# Patient Record
Sex: Female | Born: 1985 | Race: White | Hispanic: No | Marital: Married | Smoking: Never smoker
Health system: Southern US, Community
[De-identification: ages and names within clinical notes are randomized; demographics above are authoritative.]

## PROBLEM LIST (undated history)

## (undated) DIAGNOSIS — C439 Malignant melanoma of skin, unspecified: Secondary | ICD-10-CM

## (undated) HISTORY — PX: SKIN BIOPSY: SHX1

## (undated) HISTORY — DX: Malignant melanoma of skin, unspecified: C43.9

---

## 2013-12-09 DIAGNOSIS — R87612 Low grade squamous intraepithelial lesion on cytologic smear of cervix (LGSIL): Secondary | ICD-10-CM | POA: Insufficient documentation

## 2014-06-02 DIAGNOSIS — Z87898 Personal history of other specified conditions: Secondary | ICD-10-CM | POA: Insufficient documentation

## 2014-06-02 DIAGNOSIS — Z0000 Encounter for general adult medical examination without abnormal findings: Secondary | ICD-10-CM | POA: Insufficient documentation

## 2014-06-02 DIAGNOSIS — D239 Other benign neoplasm of skin, unspecified: Secondary | ICD-10-CM | POA: Insufficient documentation

## 2014-06-02 DIAGNOSIS — Z86006 Personal history of melanoma in-situ: Secondary | ICD-10-CM | POA: Insufficient documentation

## 2019-02-04 ENCOUNTER — Other Ambulatory Visit: Payer: Self-pay

## 2019-02-08 ENCOUNTER — Ambulatory Visit (INDEPENDENT_AMBULATORY_CARE_PROVIDER_SITE_OTHER): Payer: BC Managed Care – PPO

## 2019-02-08 ENCOUNTER — Other Ambulatory Visit: Payer: Self-pay

## 2019-02-08 ENCOUNTER — Ambulatory Visit (HOSPITAL_COMMUNITY)
Admission: EM | Admit: 2019-02-08 | Discharge: 2019-02-08 | Disposition: A | Discharge status: Home / Self Care | Payer: BC Managed Care – PPO

## 2019-02-08 ENCOUNTER — Encounter (HOSPITAL_COMMUNITY): Payer: Self-pay

## 2019-02-08 DIAGNOSIS — R0781 Pleurodynia: Secondary | ICD-10-CM | POA: Diagnosis not present

## 2019-02-08 MED ORDER — DEXAMETHASONE SODIUM PHOSPHATE 10 MG/ML IJ SOLN
10.0000 mg | Freq: Once | INTRAMUSCULAR | Status: AC
  Administered 2019-02-08: 10 mg via INTRAMUSCULAR

## 2019-02-08 MED ORDER — DEXAMETHASONE SODIUM PHOSPHATE 10 MG/ML IJ SOLN
INTRAMUSCULAR | Status: AC
  Filled 2019-02-08: qty 1

## 2019-02-08 NOTE — ED Triage Notes (Signed)
Pt presents to the UC with SOB x 8 days. Pt tested positive x COVID 5 days ago. Pt states she has chest pain when she breath and SOB when walking x 8 days.

## 2019-02-08 NOTE — Discharge Instructions (Addendum)
Your x-ray was normal. Steroid injection given here for chest pain and lung inflammation. Go home and rest and follow-up as needed

## 2019-02-09 NOTE — ED Provider Notes (Signed)
[facility]    CSN: [id number] Arrival date & time: 02/08/19  1113      History   Chief Complaint Chief Complaint  Patient presents with  . Shortness of Breath    + COVID  . Chest Pain    HPI Analyce Becker is a 33 y.o. female.   Patient is a 33 year old female who presents today with shortness of breath x8 days.  She tested positive for Covid approximately 5 days ago.  Reporting pain in chest with deep breathing and shortness of breath with walking.  Symptoms have been constant, waxing waning. Mild cough.   Reporting she is typically very active and in pretty good health.  She has been working outside.  No fevers, chills, body aches, night sweats.  ROS per HPI    Shortness of Breath Associated symptoms: chest pain   Chest Pain Associated symptoms: shortness of breath     History reviewed. No pertinent past medical history.  There are no problems to display for this patient.   Past Surgical History:  Procedure Laterality Date  . SKIN BIOPSY      OB History   No obstetric history on file.      Home Medications    Prior to Admission medications   Medication Sig Start Date End Date Taking? Authorizing Provider  Pseudoephedrine-guaiFENesin [facility] D PO) Take by mouth.   Yes [provider]  medroxyPROGESTERone (DEPO-PROVERA) 150 MG/ML injection Inject 150 mg into the muscle every 3 (three) months. 01/14/19   [provider]    Family History Family History  Problem Relation Age of Onset  . Healthy Mother   . Healthy Father     Social History Social History   Tobacco Use  . Smoking status: Never Smoker  . Smokeless tobacco: Never Used  Substance Use Topics  . Alcohol use: Yes  . Drug use: Never     Allergies   Patient has no known allergies.   Review of Systems Review of Systems  Respiratory: Positive for shortness of breath.   Cardiovascular: Positive for chest pain.     Physical Exam Triage Vital  Signs ED Triage Vitals  Enc Vitals Group     BP 02/08/19 1232 117/84     Pulse Rate 02/08/19 1232 91     Resp 02/08/19 1232 16     Temp 02/08/19 1232 98.1 F (36.7 C)     Temp Source 02/08/19 1232 Oral     SpO2 02/08/19 1232 100 %     Weight --      Height --      Head Circumference --      Peak Flow --      Pain Score 02/08/19 1229 6     Pain Loc --      Pain Edu? --      Excl. in GC? --    No data found.  Updated Vital Signs BP 117/84 (BP Location: Left Arm)   Pulse 91   Temp 98.1 F (36.7 C) (Oral)   Resp 16   SpO2 100%   Visual Acuity Right Eye Distance:   Left Eye Distance:   Bilateral Distance:    Right Eye Near:   Left Eye Near:    Bilateral Near:     Physical Exam Vitals and nursing note reviewed.  Constitutional:      General: She is not in acute distress.    Appearance: Normal appearance. She is well-developed. She is not ill-appearing, toxic-appearing or  diaphoretic.  HENT:     Head: Normocephalic and atraumatic.     Nose: Nose normal.     Mouth/Throat:     Pharynx: Oropharynx is clear.  Eyes:     Conjunctiva/sclera: Conjunctivae normal.  Cardiovascular:     Rate and Rhythm: Normal rate and regular rhythm.     Heart sounds: No murmur.  Pulmonary:     Effort: Pulmonary effort is normal. No respiratory distress.     Breath sounds: Normal breath sounds.  Musculoskeletal:        General: Normal range of motion.     Cervical back: Neck supple.  Skin:    General: Skin is warm and dry.  Neurological:     Mental Status: She is alert.  Psychiatric:        Mood and Affect: Mood normal.      UC Treatments / Results  Labs (all labs ordered are listed, but only abnormal results are displayed) Labs Reviewed - No data to display  EKG   Radiology DG Chest 2 View  Result Date: 02/08/2019 CLINICAL DATA:  Cough, shortness of breath, COVID-19 positive. EXAM: CHEST - 2 VIEW COMPARISON:  None. FINDINGS: The heart size and mediastinal contours are  within normal limits. There is no focal pneumonia or pulmonary edema. There are probable minimal bilateral pleural effusions. The visualized skeletal structures are unremarkable. IMPRESSION: No focal pneumonia.  Probable minimal bilateral pleural effusions. Electronically Signed   By: Abelardo Diesel M.D.   On: 02/08/2019 12:57    Procedures Procedures (including critical care time)  Medications Ordered in UC Medications  dexamethasone (DECADRON) injection 10 mg (10 mg Intramuscular Given 02/08/19 1317)    Initial Impression / Assessment and Plan / UC Course  I have reviewed the triage vital signs and the nursing notes.  Pertinent labs & imaging results that were available during my care of the patient were reviewed by me and considered in my medical decision making (see chart for details).     Pleuritic chest pain-most likely from the virus. Chest x-ray without any acute abnormalities Will give Decadron injection here for lung inflammation.  This should improve her symptoms. Recommended go home rest and avoid strenuous activity for the next week. Vital signs are normal with a 100 percent oxygen saturations. Final Clinical Impressions(s) / UC Diagnoses   Final diagnoses:  Chest pain, pleuritic     Discharge Instructions     Your x-ray was normal. Steroid injection given here for chest pain and lung inflammation. Go home and rest and follow-up as needed    ED Prescriptions    None     PDMP not reviewed this encounter.   Orvan July, NP 02/09/19 1525

## 2019-06-24 ENCOUNTER — Other Ambulatory Visit: Payer: Self-pay

## 2019-06-24 ENCOUNTER — Ambulatory Visit
Admission: EM | Admit: 2019-06-24 | Discharge: 2019-06-24 | Disposition: A | Discharge status: Home / Self Care | Payer: BC Managed Care – PPO | Attending: Emergency Medicine | Admitting: Emergency Medicine

## 2019-06-24 DIAGNOSIS — J019 Acute sinusitis, unspecified: Secondary | ICD-10-CM | POA: Diagnosis not present

## 2019-06-24 MED ORDER — AMOXICILLIN-POT CLAVULANATE 875-125 MG PO TABS: 1.0000 | ORAL_TABLET | Freq: Two times a day (BID) | ORAL | 0 refills | Status: AC

## 2019-06-24 MED ORDER — FLUCONAZOLE 200 MG PO TABS: ORAL_TABLET | ORAL | 0 refills | Status: DC

## 2019-06-24 NOTE — ED Provider Notes (Signed)
[facility]   [id number] 06/24/19 Arrival Time: 6387   CC: sinus congestion  SUBJECTIVE: History from: patient.  Amber Becker is a 34 y.o. female who presents with nasal congestion and sinus headache x 2 weeks.  Daughter with similar symptoms.  Has tried OTC medications without relief.  Denies aggravating factors.  Reports previous symptoms in the past with sinus infection.   Denies fever, chills, sore throat, SOB, wheezing, chest pain, nausea, vomiting, changes in bowel or bladder habits.     ROS: As per HPI.  All other pertinent ROS negative.     History reviewed. No pertinent past medical history. Past Surgical History:  Procedure Laterality Date  . SKIN BIOPSY     No Known Allergies No current facility-administered medications on file prior to encounter.   Current Outpatient Medications on File Prior to Encounter  Medication Sig Dispense Refill  . Pseudoephedrine-guaiFENesin (MUCINEX D PO) Take by mouth.    . [DISCONTINUED] medroxyPROGESTERone (DEPO-PROVERA) 150 MG/ML injection Inject 150 mg into the muscle every 3 (three) months.     Social History   Socioeconomic History  . Marital status: Married    Spouse name: Not on file  . Number of children: Not on file  . Years of education: Not on file  . Highest education level: Not on file  Occupational History  . Not on file  Tobacco Use  . Smoking status: Never Smoker  . Smokeless tobacco: Never Used  Substance and Sexual Activity  . Alcohol use: Yes  . Drug use: Never  . Sexual activity: Yes  Other Topics Concern  . Not on file  Social History Narrative  . Not on file   Social Determinants of Health   Financial Resource Strain:   . Difficulty of Paying Living Expenses:   Food Insecurity:   . Worried About Programme researcher, broadcasting/film/video in the Last Year:   . Barista in the Last Year:   Transportation Needs:   . Freight forwarder (Medical):   Marland Kitchen Lack of Transportation (Non-Medical):   Physical  Activity:   . Days of Exercise per Week:   . Minutes of Exercise per Session:   Stress:   . Feeling of Stress :   Social Connections:   . Frequency of Communication with Friends and Family:   . Frequency of Social Gatherings with Friends and Family:   . Attends Religious Services:   . Active Member of Clubs or Organizations:   . Attends Banker Meetings:   Marland Kitchen Marital Status:   Intimate Partner Violence:   . Fear of Current or Ex-Partner:   . Emotionally Abused:   Marland Kitchen Physically Abused:   . Sexually Abused:    Family History  Problem Relation Age of Onset  . Healthy Mother   . Healthy Father     OBJECTIVE:  Vitals:   06/24/19 0857  BP: (!) 134/93  Pulse: 62  Resp: 18  Temp: 97.6 F (36.4 C)  SpO2: 97%     General appearance: alert; appears mildly fatigued, but nontoxic; speaking in full sentences and tolerating own secretions HEENT: NCAT; Ears: EACs clear, TMs pearly gray; Eyes: PERRL.  EOM grossly intact. Sinuses: TTP over frontal and maxillary sinuses; Nose: nares patent without rhinorrhea, Throat: oropharynx clear, tonsils non erythematous or enlarged, uvula midline  Neck: supple without LAD Lungs: unlabored respirations, symmetrical air entry; cough: absent; no respiratory distress; CTAB Heart: regular rate and rhythm.   Skin: warm and dry Psychological:  alert and cooperative; normal mood and affect  ASSESSMENT & PLAN:  1. Acute non-recurrent sinusitis, unspecified location     Meds ordered this encounter  Medications  . amoxicillin-clavulanate (AUGMENTIN) 875-125 MG tablet    Sig: Take 1 tablet by mouth every 12 (twelve) hours for 10 days.    Dispense:  20 tablet    Refill:  0    Order Specific Question:   Supervising Provider    Answer:   Raylene Everts [[id number]]  . fluconazole (DIFLUCAN) 200 MG tablet    Sig: Take one dose by mouth, wait 72 hours, and then take second dose by mouth    Dispense:  2 tablet    Refill:  0    Order Specific  Question:   Supervising Provider    Answer:   Raylene Everts [[id number]]   Get plenty of rest and push fluids Augmentin prescribed for sinus infection.  Take as directed and to completon Use OTC zyrtec for nasal congestion, runny nose, and/or sore throat Use OTC flonase for nasal congestion and runny nose Use medications daily for symptom relief Use OTC medications like ibuprofen or tylenol as needed fever or pain Call or go to the ED if you have any new or worsening symptoms such as fever, worsening cough, shortness of breath, chest tightness, chest pain, turning blue, changes in mental status, etc...   Diflucan sent for possible yeast infection secondary to taking antibiotic  Reviewed expectations re: course of current medical issues. Questions answered. Outlined signs and symptoms indicating need for more acute intervention. Patient verbalized understanding. After Visit Summary given.         Lestine Box, PA-C 06/24/19 [phone]

## 2019-06-24 NOTE — ED Triage Notes (Signed)
Pt presents with c/o nasal congestion and head ache for almost 2 weeks

## 2019-06-24 NOTE — Discharge Instructions (Signed)
Get plenty of rest and push fluids Augmentin prescribed for sinus infection.  Take as directed and to completon Use OTC zyrtec for nasal congestion, runny nose, and/or sore throat Use OTC flonase for nasal congestion and runny nose Use medications daily for symptom relief Use OTC medications like ibuprofen or tylenol as needed fever or pain Call or go to the ED if you have any new or worsening symptoms such as fever, worsening cough, shortness of breath, chest tightness, chest pain, turning blue, changes in mental status, etc..Marland Kitchen

## 2021-04-14 IMAGING — DX DG CHEST 2V
2 series · 2 of 2 positions shown · non-contrast
Comparison: None.

CLINICAL DATA: Cough, shortness of breath, [id number] positive.

EXAM:
CHEST - 2 VIEW

[chest pa]
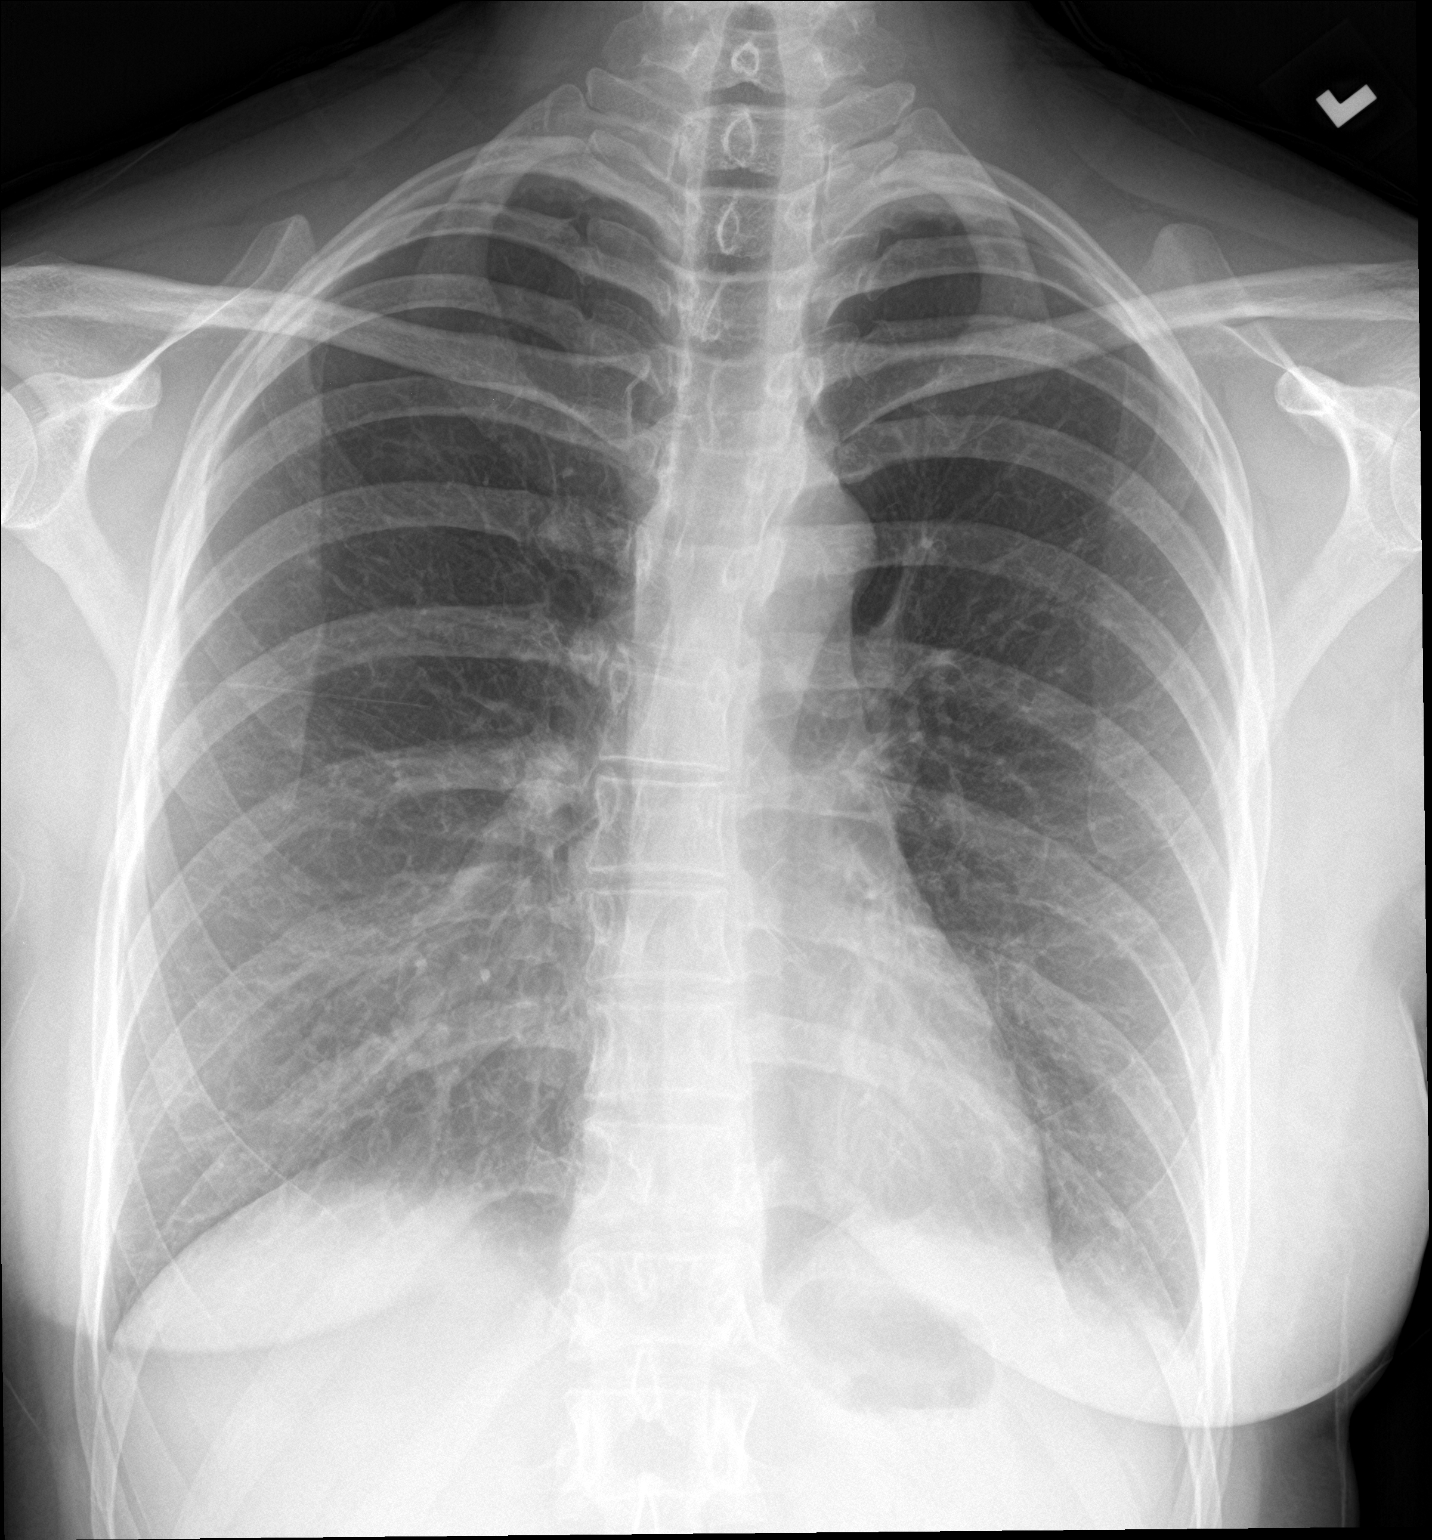

[chest lat]
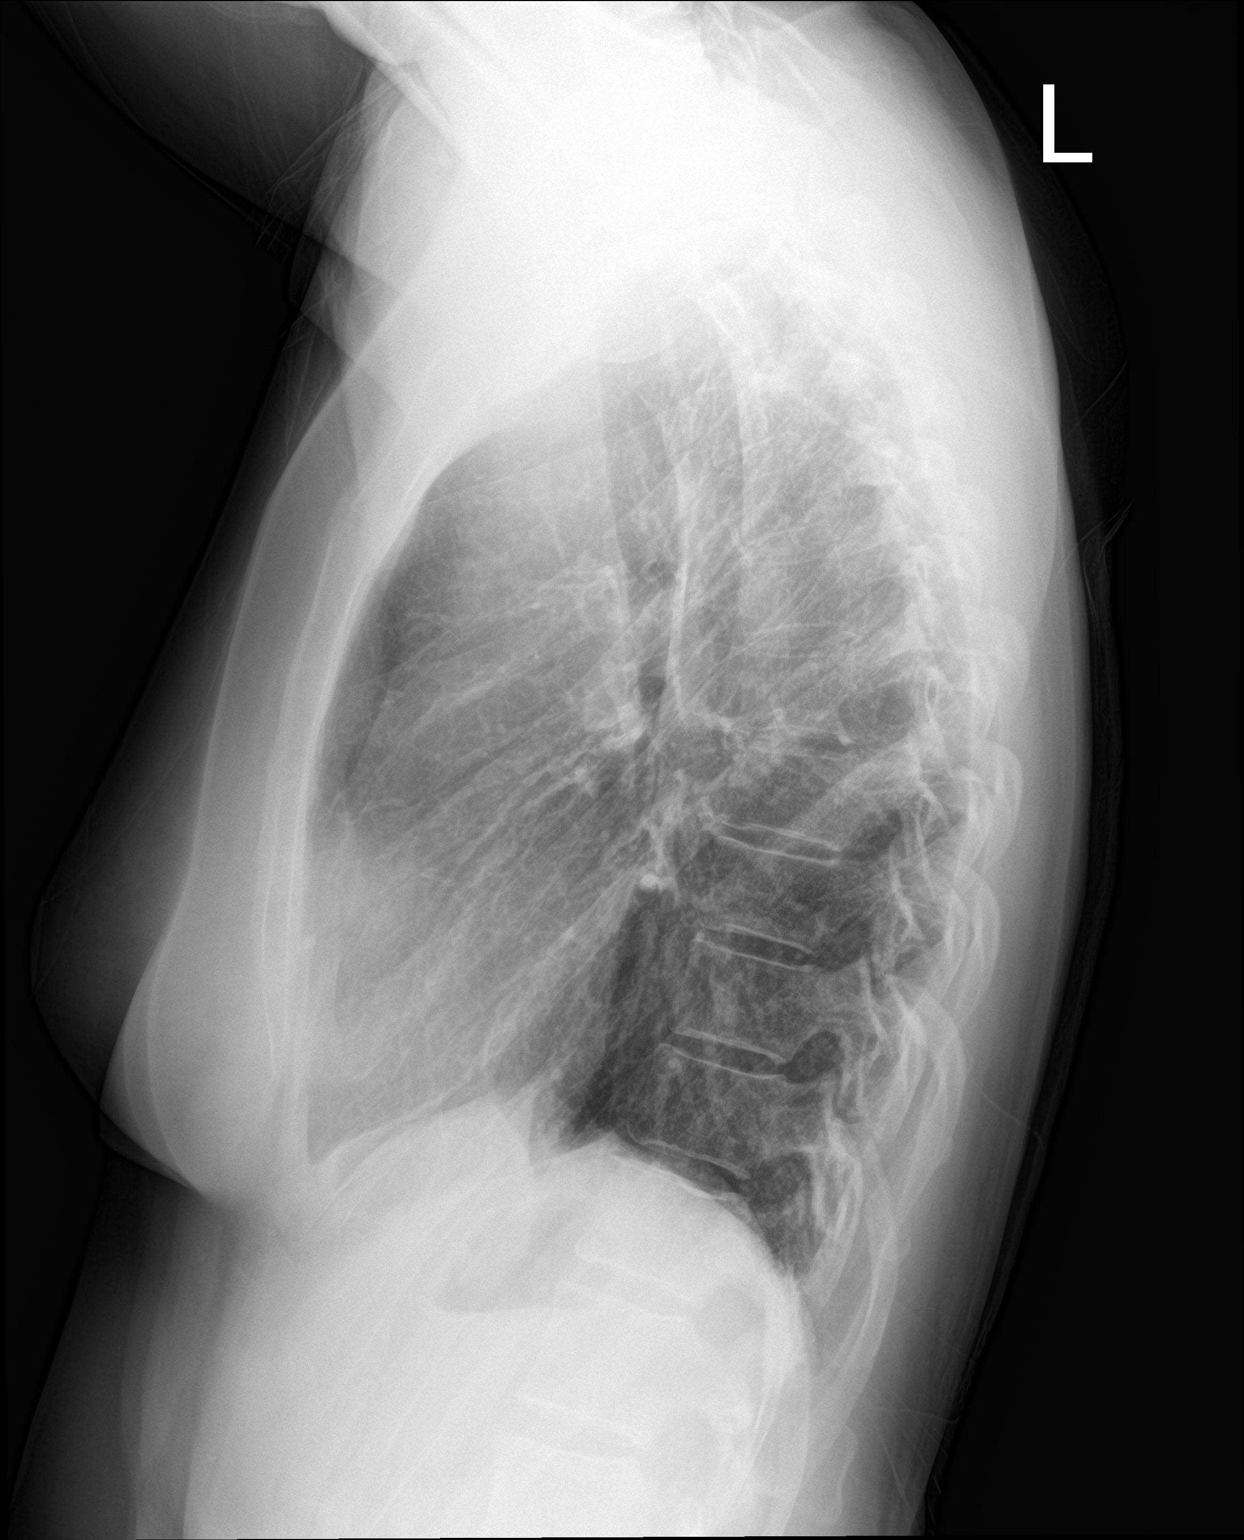

[2 of 2 positions shown; findings below may reference images not displayed]

FINDINGS: The heart size and mediastinal contours are within normal limits.
There is no focal pneumonia or pulmonary edema. There are probable
minimal bilateral pleural effusions. The visualized skeletal
structures are unremarkable.
IMPRESSION: No focal pneumonia.  Probable minimal bilateral pleural effusions.

## 2021-08-14 ENCOUNTER — Ambulatory Visit: Payer: Self-pay

## 2022-10-17 DIAGNOSIS — R87810 Cervical high risk human papillomavirus (HPV) DNA test positive: Secondary | ICD-10-CM | POA: Diagnosis not present

## 2022-10-17 DIAGNOSIS — Z1331 Encounter for screening for depression: Secondary | ICD-10-CM | POA: Diagnosis not present

## 2022-10-17 DIAGNOSIS — Z01419 Encounter for gynecological examination (general) (routine) without abnormal findings: Secondary | ICD-10-CM | POA: Diagnosis not present

## 2023-03-16 DIAGNOSIS — F411 Generalized anxiety disorder: Secondary | ICD-10-CM | POA: Diagnosis not present

## 2023-03-16 DIAGNOSIS — J45909 Unspecified asthma, uncomplicated: Secondary | ICD-10-CM | POA: Diagnosis not present

## 2023-03-16 DIAGNOSIS — Z0000 Encounter for general adult medical examination without abnormal findings: Secondary | ICD-10-CM | POA: Diagnosis not present

## 2023-03-16 DIAGNOSIS — Z85820 Personal history of malignant melanoma of skin: Secondary | ICD-10-CM | POA: Diagnosis not present

## 2023-03-16 DIAGNOSIS — Z87891 Personal history of nicotine dependence: Secondary | ICD-10-CM | POA: Diagnosis not present

## 2023-06-15 ENCOUNTER — Other Ambulatory Visit: Payer: Self-pay | Admitting: Medical Genetics

## 2023-07-10 DIAGNOSIS — M542 Cervicalgia: Secondary | ICD-10-CM | POA: Diagnosis not present

## 2023-07-10 DIAGNOSIS — M9911 Subluxation complex (vertebral) of cervical region: Secondary | ICD-10-CM | POA: Diagnosis not present

## 2023-07-10 DIAGNOSIS — M62830 Muscle spasm of back: Secondary | ICD-10-CM | POA: Diagnosis not present

## 2023-07-10 DIAGNOSIS — M9913 Subluxation complex (vertebral) of lumbar region: Secondary | ICD-10-CM | POA: Diagnosis not present

## 2023-07-10 DIAGNOSIS — M9912 Subluxation complex (vertebral) of thoracic region: Secondary | ICD-10-CM | POA: Diagnosis not present

## 2023-07-10 DIAGNOSIS — M546 Pain in thoracic spine: Secondary | ICD-10-CM | POA: Diagnosis not present

## 2023-08-28 ENCOUNTER — Ambulatory Visit: Admitting: Dermatology

## 2023-08-28 ENCOUNTER — Encounter: Payer: Self-pay | Admitting: Dermatology

## 2023-08-28 VITALS — BP 105/78 | HR 77

## 2023-08-28 DIAGNOSIS — D492 Neoplasm of unspecified behavior of bone, soft tissue, and skin: Secondary | ICD-10-CM | POA: Diagnosis not present

## 2023-08-28 DIAGNOSIS — Z1283 Encounter for screening for malignant neoplasm of skin: Secondary | ICD-10-CM | POA: Diagnosis not present

## 2023-08-28 DIAGNOSIS — D229 Melanocytic nevi, unspecified: Secondary | ICD-10-CM

## 2023-08-28 DIAGNOSIS — D1801 Hemangioma of skin and subcutaneous tissue: Secondary | ICD-10-CM | POA: Diagnosis not present

## 2023-08-28 DIAGNOSIS — Z85820 Personal history of malignant melanoma of skin: Secondary | ICD-10-CM

## 2023-08-28 DIAGNOSIS — L821 Other seborrheic keratosis: Secondary | ICD-10-CM

## 2023-08-28 DIAGNOSIS — D225 Melanocytic nevi of trunk: Secondary | ICD-10-CM

## 2023-08-28 DIAGNOSIS — L814 Other melanin hyperpigmentation: Secondary | ICD-10-CM

## 2023-08-28 DIAGNOSIS — L578 Other skin changes due to chronic exposure to nonionizing radiation: Secondary | ICD-10-CM | POA: Diagnosis not present

## 2023-08-28 DIAGNOSIS — D485 Neoplasm of uncertain behavior of skin: Secondary | ICD-10-CM

## 2023-08-28 DIAGNOSIS — D2272 Melanocytic nevi of left lower limb, including hip: Secondary | ICD-10-CM | POA: Diagnosis not present

## 2023-08-28 DIAGNOSIS — W908XXA Exposure to other nonionizing radiation, initial encounter: Secondary | ICD-10-CM | POA: Diagnosis not present

## 2023-08-28 NOTE — Progress Notes (Unsigned)
 New Patient Visit   Subjective  Amber Becker is a 38 y.o. female who presents for the following: Skin Cancer Screening and Full Body Skin Exam  The patient presents for Total-Body Skin Exam (TBSE) for skin cancer screening and mole check. The patient has spots, moles and lesions to be evaluated, some may be new or changing.  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  The following portions of the chart were reviewed this encounter and updated as appropriate: medications, allergies, medical history  Review of Systems:  No other skin or systemic complaints except as noted in HPI or Assessment and Plan.  Objective  Well appearing patient in no apparent distress; mood and affect are within normal limits.  A full examination was performed including scalp, head, eyes, ears, nose, lips, neck, chest, axillae, abdomen, back, buttocks, bilateral upper extremities, bilateral lower extremities, hands, feet, fingers, toes, fingernails, and toenails. All findings within normal limits unless otherwise noted below.   Relevant physical exam findings are noted in the Assessment and Plan.  Left Lower Leg - Anterior 2 mm brown macule  Right Lower Back 5 mm brown papule  Left Lower Back 6 mm brown papule with two tones   Assessment & Plan   SKIN CANCER SCREENING PERFORMED TODAY.  ACTINIC DAMAGE - Chronic condition, secondary to cumulative UV/sun exposure - diffuse scaly erythematous macules with underlying dyspigmentation - Recommend daily broad spectrum sunscreen SPF 30+ to sun-exposed areas, reapply every 2 hours as needed.  - Staying in the shade or wearing long sleeves, sun glasses (UVA+UVB protection) and wide brim hats (4-inch brim around the entire circumference of the hat) are also recommended for sun protection.  -  Call for new or changing lesions.  LENTIGINES, SEBORRHEIC KERATOSES, HEMANGIOMAS - Benign normal skin lesions - Benign-appearing - Call for any changes  MELANOCYTIC NEVI - Tan-brown and/or pink-flesh-colored symmetric macules and papules - Benign appearing on exam today - Observation - Call clinic for new or changing moles - Recommend daily use of broad spectrum spf 30+ sunscreen to sun-exposed areas.   HISTORY OF MELANOMA - No evidence of recurrence today - No lymphadenopathy - Recommend regular full body skin exams - Recommend daily broad spectrum sunscreen SPF 30+ to sun-exposed areas, reapply every 2 hours as needed.  - Call if any new or changing lesions are noted between office visits   NEOPLASM OF UNCERTAIN BEHAVIOR OF SKIN (3) Left Lower Leg - Anterior Skin / nail biopsy Type of biopsy: tangential   Informed consent: discussed and consent obtained   Timeout: patient name, date of birth, surgical site, and procedure verified   Procedure prep:  Patient was prepped and draped in usual sterile fashion Prep type:  Isopropyl alcohol Anesthesia: the lesion was anesthetized in a standard fashion   Anesthetic:  1% lidocaine w/ epinephrine 1-100,000 buffered w/ 8.4% NaHCO3 Instrument used: DermaBlade   Hemostasis achieved with: aluminum chloride   Outcome: patient tolerated procedure well   Post-procedure details: sterile dressing applied and wound care instructions given   Dressing type: petrolatum gauze and bandage    Specimen 1 - Surgical pathology Differential Diagnosis: r/o DN VS MM  Check Margins: No Right Lower Back Skin / nail biopsy Type of biopsy: tangential   Informed consent: discussed and consent obtained   Timeout: patient name, date  of birth, surgical site, and procedure verified   Procedure prep:  Patient was prepped and draped in usual sterile fashion Prep type:  Isopropyl alcohol Anesthesia: the lesion was anesthetized in a standard fashion    Anesthetic:  1% lidocaine w/ epinephrine 1-100,000 buffered w/ 8.4% NaHCO3 Instrument used: DermaBlade   Hemostasis achieved with: aluminum chloride   Outcome: patient tolerated procedure well   Post-procedure details: sterile dressing applied and wound care instructions given   Dressing type: petrolatum gauze and bandage    Specimen 2 - Surgical pathology Differential Diagnosis: r/o DN VS MM  Check Margins: No Left Lower Back Skin / nail biopsy Type of biopsy: tangential   Informed consent: discussed and consent obtained   Timeout: patient name, date of birth, surgical site, and procedure verified   Procedure prep:  Patient was prepped and draped in usual sterile fashion Prep type:  Isopropyl alcohol Anesthesia: the lesion was anesthetized in a standard fashion   Anesthetic:  1% lidocaine w/ epinephrine 1-100,000 buffered w/ 8.4% NaHCO3 Instrument used: DermaBlade   Hemostasis achieved with: aluminum chloride   Outcome: patient tolerated procedure well   Post-procedure details: sterile dressing applied and wound care instructions given   Dressing type: petrolatum gauze and bandage    Specimen 3 - Surgical pathology Differential Diagnosis: r/o DN VS MM  Check Margins: No PERSONAL HISTORY OF MALIGNANT MELANOMA OF SKIN   MULTIPLE BENIGN NEVI   LENTIGINES   CHERRY ANGIOMA   ACTINIC SKIN DAMAGE   HEMANGIOMA OF SKIN    Return in about 6 months (around 02/28/2024) for TBSC.  I, Berwyn Lesches, Surg Tech III, am acting as scribe for RUFUS CHRISTELLA HOLY, MD.   Documentation: I have reviewed the above documentation for accuracy and completeness, and I agree with the above.  RUFUS CHRISTELLA HOLY, MD

## 2023-08-28 NOTE — Patient Instructions (Signed)

## 2023-08-29 LAB — SURGICAL PATHOLOGY

## 2023-08-31 ENCOUNTER — Ambulatory Visit: Payer: Self-pay | Admitting: Dermatology

## 2023-08-31 NOTE — Telephone Encounter (Signed)
-----   Message from [facility] PACI sent at 08/31/2023 10:58 AM EDT ----- Lower lower leg- DN mod to severe- WLE Right lower back- DN moderate to severe- WLE Left lower back- DN moderate- monitor Please call patient to get scheduled for WLEs   ----- Message ----- From: Interface, Lab In [facility] Sent: 08/29/2023   5:58 PM EDT To: Rufus CHRISTELLA Holy, MD

## 2023-10-18 DIAGNOSIS — R87610 Atypical squamous cells of undetermined significance on cytologic smear of cervix (ASC-US): Secondary | ICD-10-CM | POA: Diagnosis not present

## 2023-10-18 DIAGNOSIS — Z1331 Encounter for screening for depression: Secondary | ICD-10-CM | POA: Diagnosis not present

## 2023-10-18 DIAGNOSIS — Z113 Encounter for screening for infections with a predominantly sexual mode of transmission: Secondary | ICD-10-CM | POA: Diagnosis not present

## 2023-10-18 DIAGNOSIS — Z01419 Encounter for gynecological examination (general) (routine) without abnormal findings: Secondary | ICD-10-CM | POA: Diagnosis not present

## 2023-10-31 ENCOUNTER — Encounter: Payer: Self-pay | Admitting: Dermatology

## 2023-11-05 ENCOUNTER — Encounter: Payer: Self-pay | Admitting: Dermatology

## 2023-11-05 ENCOUNTER — Ambulatory Visit: Admitting: Dermatology

## 2023-11-05 VITALS — BP 114/79 | HR 64 | Temp 98.3°F

## 2023-11-05 DIAGNOSIS — D225 Melanocytic nevi of trunk: Secondary | ICD-10-CM | POA: Diagnosis not present

## 2023-11-05 DIAGNOSIS — L988 Other specified disorders of the skin and subcutaneous tissue: Secondary | ICD-10-CM | POA: Diagnosis not present

## 2023-11-05 DIAGNOSIS — D239 Other benign neoplasm of skin, unspecified: Secondary | ICD-10-CM

## 2023-11-05 NOTE — Progress Notes (Signed)
 Follow-Up Visit   Subjective  Amber Becker is a 38 y.o. female who presents for the following: Excision of a DN severe on the right lower back, biopsied by Dr. Corey.  The following portions of the chart were reviewed this encounter and updated as appropriate: medications, allergies, medical history  Review of Systems:  No other skin or systemic complaints except as noted in HPI or Assessment and Plan.  Objective  Well appearing patient in no apparent distress; mood and affect are within normal limits.  A focused examination was performed of the following areas: Right lower back Relevant physical exam findings are noted in the Assessment and Plan.   Right Lower Back Healing biopsy site  Assessment & Plan   DYSPLASTIC NEVUS Right Lower Back Skin excision - Right Lower Back  Excision method:  elliptical Lesion length (cm):  1.1 Lesion width (cm):  0.8 Margin per side (cm):  0.5 Total excision diameter (cm):  2.1 Informed consent: discussed and consent obtained   Timeout: patient name, date of birth, surgical site, and procedure verified   Procedure prep:  Patient was prepped and draped in usual sterile fashion Prep type:  Chlorhexidine Anesthesia: the lesion was anesthetized in a standard fashion   Anesthetic:  1% lidocaine w/ epinephrine 1-100,000 local infiltration Instrument used: #15 blade   Hemostasis achieved with: suture, pressure and electrodesiccation   Outcome: patient tolerated procedure well with no complications   Post-procedure details: sterile dressing applied and wound care instructions given   Dressing type: bandage and pressure dressing    Skin repair - Right Lower Back Complexity:  Complex Final length (cm):  5 Informed consent: discussed and consent obtained   Timeout: patient name, date of birth, surgical site, and procedure verified   Procedure prep:  Patient was prepped and draped in usual sterile fashion Prep type:  Chlorhexidine Anesthesia:  the lesion was anesthetized in a standard fashion   Anesthetic:  1% lidocaine w/ epinephrine 1-100,000 buffered w/ 8.4% NaHCO3 Reason for type of repair: reduce tension to allow closure, preserve normal anatomy, preserve normal anatomical and functional relationships, avoid adjacent structures and allow side-to-side closure without requiring a flap or graft   Undermining: area extensively undermined   Subcutaneous layers (deep stitches):  Suture size:  3-0 Suture type: PDS (polydioxanone)   Stitches:  Buried vertical mattress Fine/surface layer approximation (top stitches):  Suture type: cyanoacrylate tissue glue   Hemostasis achieved with: suture, pressure and electrodesiccation Outcome: patient tolerated procedure well with no complications   Post-procedure details: sterile dressing applied and wound care instructions given   Dressing type: petrolatum and pressure dressing    Specimen 1 - Surgical pathology Differential Diagnosis: DN [id number] Check Margins: No  The surgical wound was then cleaned, prepped, and re-anesthetized as above. Wound edges were undermined extensively along at least one entire edge and at a distance equal to or greater than the width of the defect (see wound defect size above) in order to achieve closure and decrease wound tension and anatomic distortion. Redundant tissue repair including standing cone removal was performed. Hemostasis was achieved with electrocautery. Subcutaneous and epidermal tissues were approximated with the above sutures. The surgical site was then lightly scrubbed with sterile, saline-soaked gauze. Steri-strips were applied, and the area was then bandaged using Vaseline ointment, non-adherent gauze, gauze pads, and tape to provide an adequate pressure dressing. The patient tolerated the procedure well, was given detailed written and verbal wound care instructions, and was discharged in good condition.  The patient will follow-up:  PRN.  Return if symptoms worsen or fail to improve.   Documentation: I have reviewed the above documentation for accuracy and completeness, and I agree with the above.  RUFUS CHRISTELLA HOLY, MD

## 2023-11-05 NOTE — Patient Instructions (Signed)

## 2023-11-06 ENCOUNTER — Ambulatory Visit: Payer: Self-pay | Admitting: Dermatology

## 2023-11-06 LAB — SURGICAL PATHOLOGY

## 2023-11-12 ENCOUNTER — Encounter: Payer: Self-pay | Admitting: Dermatology

## 2023-11-15 DIAGNOSIS — Z118 Encounter for screening for other infectious and parasitic diseases: Secondary | ICD-10-CM | POA: Diagnosis not present

## 2023-11-15 DIAGNOSIS — N898 Other specified noninflammatory disorders of vagina: Secondary | ICD-10-CM | POA: Diagnosis not present

## 2023-11-15 DIAGNOSIS — Z7251 High risk heterosexual behavior: Secondary | ICD-10-CM | POA: Diagnosis not present

## 2023-11-15 DIAGNOSIS — Z113 Encounter for screening for infections with a predominantly sexual mode of transmission: Secondary | ICD-10-CM | POA: Diagnosis not present

## 2023-11-19 ENCOUNTER — Ambulatory Visit: Admitting: Dermatology

## 2023-11-19 ENCOUNTER — Encounter: Payer: Self-pay | Admitting: Dermatology

## 2023-11-19 VITALS — BP 118/77 | HR 61

## 2023-11-19 DIAGNOSIS — L905 Scar conditions and fibrosis of skin: Secondary | ICD-10-CM

## 2023-11-19 DIAGNOSIS — D2272 Melanocytic nevi of left lower limb, including hip: Secondary | ICD-10-CM | POA: Diagnosis not present

## 2023-11-19 DIAGNOSIS — D239 Other benign neoplasm of skin, unspecified: Secondary | ICD-10-CM

## 2023-11-19 MED ORDER — MUPIROCIN 2 % EX OINT: 1.0000 | TOPICAL_OINTMENT | Freq: Two times a day (BID) | CUTANEOUS | 1 refills | Status: AC

## 2023-11-19 NOTE — Patient Instructions (Signed)

## 2023-11-19 NOTE — Progress Notes (Signed)
   Follow-Up Visit   Subjective  Amber Becker is a 38 y.o. female who presents for the following: Excision of a DN moderate to severe on the left lower leg anterior.  She is also s/p WLE for a DN moderate to severe on the right lower back, treated on 09/22/025, repaired with linear closure, healing well.   The following portions of the chart were reviewed this encounter and updated as appropriate: medications, allergies, medical history  Review of Systems:  No other skin or systemic complaints except as noted in HPI or Assessment and Plan.  Objective  Well appearing patient in no apparent distress; mood and affect are within normal limits.  A focused examination was performed of the following areas: Left lower leg anterior Relevant physical exam findings are noted in the Assessment and Plan.   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 Skin excision - Left Lower Leg - Anterior  Excision method:  elliptical Lesion length (cm):  0.7 Lesion width (cm):  0.6 Margin per side (cm):  0.5 Total excision diameter (cm):  1.7 Informed consent: discussed and consent obtained   Timeout: patient name, date of birth, surgical site, and procedure verified   Procedure prep:  Patient was prepped and draped in usual sterile fashion Prep type:  Chlorhexidine Anesthesia: the lesion was anesthetized in a standard fashion   Anesthetic:  1% lidocaine w/ epinephrine 1-100,000 buffered w/ 8.4% NaHCO3 Instrument used: #15 blade   Hemostasis achieved with: electrodesiccation   Outcome: patient tolerated procedure well with no complications   Post-procedure details: wound care instructions given    Specimen 1 - Surgical pathology Differential Diagnosis: DN [id number] Check Margins: No  Patient was notified of results and repair options were discussed, including second intention healing. After reviewing the advantages and  disadvantages of each, we agreed on second intention healing as appropriate.   The surgical site was then lightly scrubbed with sterile, saline-soaked gauze.  The area was bandaged using Vaseline ointment, non-adherent gauze, gauze pads, and tape to provide an adequate pressure dressing.   The patient tolerated the procedure well, was given detailed written and verbal wound care instructions, and was discharged in good condition.  The patient will follow-up in 4 weeks and as scheduled with primary dermatologist.  Scar s/p WLE for DN moderate to severe on the right lower back, treated on 09/22/025, repaired with linear closure - Reassured that wound has healed well - Steristrips still present - Discussed that scars take up to 12 months to mature from the date of surgery - Recommend SPF 30+ to scar daily to prevent purple color - OK to start scar massage at 4-6 weeks post-op - Can consider silicone based products for scar healing   Return in about 4 weeks (around 12/17/2023) for wound check.  I, Darice Smock, CMA, am acting as scribe for RUFUS CHRISTELLA HOLY, MD.   Documentation: I have reviewed the above documentation for accuracy and completeness, and I agree with the above.  RUFUS CHRISTELLA HOLY, MD

## 2023-11-20 ENCOUNTER — Ambulatory Visit: Payer: Self-pay | Admitting: Dermatology

## 2023-11-20 LAB — SURGICAL PATHOLOGY

## 2023-12-18 ENCOUNTER — Ambulatory Visit: Admitting: Dermatology

## 2024-01-02 DIAGNOSIS — Z1231 Encounter for screening mammogram for malignant neoplasm of breast: Secondary | ICD-10-CM | POA: Diagnosis not present

## 2024-03-04 ENCOUNTER — Encounter: Payer: Self-pay | Admitting: Dermatology

## 2024-03-04 ENCOUNTER — Ambulatory Visit: Admitting: Dermatology

## 2024-03-04 DIAGNOSIS — L814 Other melanin hyperpigmentation: Secondary | ICD-10-CM

## 2024-03-04 DIAGNOSIS — L578 Other skin changes due to chronic exposure to nonionizing radiation: Secondary | ICD-10-CM | POA: Diagnosis not present

## 2024-03-04 DIAGNOSIS — L821 Other seborrheic keratosis: Secondary | ICD-10-CM | POA: Diagnosis not present

## 2024-03-04 DIAGNOSIS — D1801 Hemangioma of skin and subcutaneous tissue: Secondary | ICD-10-CM

## 2024-03-04 DIAGNOSIS — Z85820 Personal history of malignant melanoma of skin: Secondary | ICD-10-CM | POA: Diagnosis not present

## 2024-03-04 DIAGNOSIS — Z1283 Encounter for screening for malignant neoplasm of skin: Secondary | ICD-10-CM | POA: Diagnosis not present

## 2024-03-04 DIAGNOSIS — D229 Melanocytic nevi, unspecified: Secondary | ICD-10-CM

## 2024-03-04 DIAGNOSIS — Z86018 Personal history of other benign neoplasm: Secondary | ICD-10-CM

## 2024-03-04 DIAGNOSIS — D239 Other benign neoplasm of skin, unspecified: Secondary | ICD-10-CM

## 2024-03-04 DIAGNOSIS — W908XXA Exposure to other nonionizing radiation, initial encounter: Secondary | ICD-10-CM | POA: Diagnosis not present

## 2024-03-04 DIAGNOSIS — L905 Scar conditions and fibrosis of skin: Secondary | ICD-10-CM

## 2024-03-04 NOTE — Progress Notes (Signed)
" ° °  Follow-Up Visit   History of Present Illness Amber Becker is a 39 year old female with a history of melanoma who presents for a dermatologic follow-up.  She is concerned about a scar from a previous procedure, noting that it is in an obvious spot and 'kind of sucks.' She has not been doing anything specific for it.  She has a history of melanoma with the last occurrence in 2019, and the last two excisions were dysplastic. She typically follows up every nine months for skin checks.  No intense sun exposure in the last few weeks or months, although she spends a lot of time outdoors and rides horses frequently.   The following portions of the chart were reviewed this encounter and updated as appropriate: medications, allergies, medical history  Review of Systems:  No other skin or systemic complaints except as noted in HPI or Assessment and Plan.  Objective  Well appearing patient in no apparent distress; mood and affect are within normal limits.  A full examination was performed including scalp, head, eyes, ears, nose, lips, neck, chest, axillae, abdomen, back, buttocks, bilateral upper extremities, bilateral lower extremities, hands, feet, fingers, toes, fingernails, and toenails. All findings within normal limits unless otherwise noted below.   Relevant physical exam findings are noted in the Assessment and Plan.    Assessment & Plan   SKIN CANCER SCREENING PERFORMED TODAY.  ACTINIC DAMAGE - Chronic condition, secondary to cumulative UV/sun exposure - diffuse scaly erythematous macules with underlying dyspigmentation - Recommend daily broad spectrum sunscreen SPF 30+ to sun-exposed areas, reapply every 2 hours as needed.  - Staying in the shade or wearing long sleeves, sun glasses (UVA+UVB protection) and wide brim hats (4-inch brim around the entire circumference of the hat) are also recommended for sun protection.  - Call for new or changing lesions.  MELANOCYTIC NEVI -  Tan-brown and/or pink-flesh-colored symmetric macules and papules - Benign appearing on exam today - Observation - Call clinic for new or changing moles - Recommend daily use of broad spectrum spf 30+ sunscreen to sun-exposed areas.   LENTIGINES Exam: scattered tan macules Due to sun exposure Treatment Plan: Benign-appearing, observe. Recommend daily broad spectrum sunscreen SPF 30+ to sun-exposed areas, reapply every 2 hours as needed.  Call for any changes   HEMANGIOMA Exam: red papule(s) Discussed benign nature. Recommend observation. Call for changes.   SEBORRHEIC KERATOSIS - Stuck-on, waxy, tan-brown papules and/or plaques  - Benign-appearing - Discussed benign etiology and prognosis. - Observe - Call for any changes  History of Dysplastic Nevi - No evidence of recurrence today - Recommend regular full body skin exams - Recommend daily broad spectrum sunscreen SPF 30+ to sun-exposed areas, reapply every 2 hours as needed.  - Call if any new or changing lesions are noted between office visits    HISTORY OF MELANOMA - No evidence of recurrence today - No lymphadenopathy - Recommend regular full body skin exams - Recommend daily broad spectrum sunscreen SPF 30+ to sun-exposed areas, reapply every 2 hours as needed.  - Call if any new or changing lesions are noted between office visits     Return in about 9 months (around 12/02/2024) for TBSE.  I, Amber Becker, CMA, am acting as scribe for Amber CHRISTELLA HOLY, MD.   Documentation: I have reviewed the above documentation for accuracy and completeness, and I agree with the above.  Amber CHRISTELLA HOLY, MD    "

## 2024-03-04 NOTE — Patient Instructions (Signed)

## 2024-12-23 ENCOUNTER — Ambulatory Visit: Admitting: Dermatology
